# Patient Record
Sex: Male | Born: 1990 | Race: White | Hispanic: No | Marital: Single | Smoking: Never smoker
Health system: Southern US, Community
[De-identification: ages and names within clinical notes are randomized; demographics above are authoritative.]

---

## 2014-03-15 ENCOUNTER — Emergency Department: Payer: Self-pay | Admitting: Internal Medicine

## 2019-04-12 ENCOUNTER — Encounter: Payer: Self-pay | Admitting: Emergency Medicine

## 2019-04-12 ENCOUNTER — Ambulatory Visit
Admission: EM | Admit: 2019-04-12 | Discharge: 2019-04-12 | Disposition: A | Discharge status: Home / Self Care | Payer: BLUE CROSS/BLUE SHIELD | Attending: Emergency Medicine | Admitting: Emergency Medicine

## 2019-04-12 ENCOUNTER — Other Ambulatory Visit: Payer: Self-pay

## 2019-04-12 ENCOUNTER — Ambulatory Visit (INDEPENDENT_AMBULATORY_CARE_PROVIDER_SITE_OTHER): Payer: BLUE CROSS/BLUE SHIELD

## 2019-04-12 DIAGNOSIS — R590 Localized enlarged lymph nodes: Secondary | ICD-10-CM

## 2019-04-12 LAB — CBC WITH DIFFERENTIAL/PLATELET
Abs Immature Granulocytes: 0.01 10*3/uL (ref 0.00–0.07)
Basophils Absolute: 0 10*3/uL (ref 0.0–0.1)
Basophils Relative: 1 %
Eosinophils Absolute: 0.1 10*3/uL (ref 0.0–0.5)
Eosinophils Relative: 2 %
HCT: 42.8 % (ref 39.0–52.0)
Hemoglobin: 14.8 g/dL (ref 13.0–17.0)
Immature Granulocytes: 0 %
Lymphocytes Relative: 41 %
Lymphs Abs: 2.4 10*3/uL (ref 0.7–4.0)
MCH: 29.7 pg (ref 26.0–34.0)
MCHC: 34.6 g/dL (ref 30.0–36.0)
MCV: 85.8 fL (ref 80.0–100.0)
Monocytes Absolute: 0.6 10*3/uL (ref 0.1–1.0)
Monocytes Relative: 11 %
Neutro Abs: 2.6 10*3/uL (ref 1.7–7.7)
Neutrophils Relative %: 45 %
Platelets: 189 10*3/uL (ref 150–400)
RBC: 4.99 MIL/uL (ref 4.22–5.81)
RDW: 12.5 % (ref 11.5–15.5)
WBC: 5.7 10*3/uL (ref 4.0–10.5)
nRBC: 0 % (ref 0.0–0.2)

## 2019-04-12 NOTE — ED Provider Notes (Signed)
[facility] ____________________________________________  Time seen: Approximately 8:36 AM  I have reviewed the triage vital signs and the nursing notes.   HISTORY  Chief Complaint Mass   HPI Luis Willis is a 28 y.o. male presenting for evaluation of mass near his collarbone that he noticed yesterday.  Patient reports that he reached to scratch his right upper chest and felt the enlargement.  Denies pain.  States that he can move the swollen area around and it is not tender.  Denies any skin changes.  Denies any recent cough, congestion, sore throat, headache, rash, insect bite or injury.  Denies chest pain, shortness of breath.  Denies recent lifestyle changes.  No history of smoking.  States that he feels well.  Does continue to remain active.   History reviewed. No pertinent past medical history. Denies   There are no active problems to display for this patient.   History reviewed. No pertinent surgical history.   No current facility-administered medications for this encounter.  No current outpatient medications on file.  Allergies Patient has no known allergies.  Family History  Problem Relation Age of Onset  . Healthy Mother   . Healthy Father     Social History Social History   Tobacco Use  . Smoking status: Never Smoker  . Smokeless tobacco: Never Used  Substance Use Topics  . Alcohol use: Not Currently  . Drug use: Never    Review of Systems Constitutional: No fever ENT: No sore throat. As above.  Cardiovascular: Denies chest pain. Respiratory: Denies shortness of breath. Gastrointestinal: No abdominal pain.   Musculoskeletal: Negative for back pain. Skin: Negative for rash. Neurological: Negative for headaches, focal weakness or numbness.  ____________________________________________   PHYSICAL EXAM:  VITAL SIGNS: ED Triage Vitals  Enc Vitals Group     BP 04/12/19 0818 110/72     Pulse Rate 04/12/19 0818 (!) 55     Resp  04/12/19 0818 16     Temp 04/12/19 0818 97.7 F (36.5 C)     Temp Source 04/12/19 0818 Oral     SpO2 04/12/19 0818 100 %     Weight 04/12/19 0815 185 lb (83.9 kg)     Height 04/12/19 0815 6' (1.829 m)     Head Circumference --      Peak Flow --      Pain Score 04/12/19 0815 0     Pain Loc --      Pain Edu? --      Excl. in GC? --     Constitutional: Alert and oriented. Well appearing and in no acute distress. Eyes: Conjunctivae are normal. PERRL.  ENT      Head: Normocephalic and atraumatic.      Nose: No congestion Neck: No stridor. Supple without meningismus.  Hematological/Lymphatic/Immunilogical: Right supraclavicular approximately 1-1.5 centimeter round mobile nontender lymph node.  No cervical lymphadenopathy.  No axillary, inguinal or popliteal lymphadenopathy noted. Cardiovascular: Normal rate, regular rhythm. Grossly normal heart sounds.  Good peripheral circulation. Respiratory: Normal respiratory effort without tachypnea nor retractions. Breath sounds are clear and equal bilaterally. No wheezes, rales, rhonchi. Gastrointestinal: Soft and nontender. Musculoskeletal: Steady gait. Neurologic:  Normal speech and language. Speech is normal. No gait instability.  Skin:  Skin is warm, dry and intact. No rash noted. Psychiatric: Mood and affect are normal. Speech and behavior are normal. Patient exhibits appropriate insight and judgment   ___________________________________________   LABS (all labs ordered are listed, but only abnormal results are displayed)  Labs Reviewed  CBC WITH DIFFERENTIAL/PLATELET    RADIOLOGY  Dg Chest 2 View  Result Date: 04/12/2019 CLINICAL DATA:  Right supraclavicular adenopathy. EXAM: CHEST - 2 VIEW COMPARISON:  None. FINDINGS: The heart size and mediastinal contours are within normal limits. Both lungs are clear. The visualized skeletal structures are unremarkable. IMPRESSION: No active cardiopulmonary disease. Electronically Signed   By:  Signa Kell M.D.   On: 04/12/2019 09:10   ____________________________________________   PROCEDURES Procedures    INITIAL IMPRESSION / ASSESSMENT AND PLAN / ED COURSE  Pertinent labs & imaging results that were available during my care of the patient were reviewed by me and considered in my medical decision making (see chart for details).  Very well-appearing patient.  No acute distress.  Right supraclavicular lymphadenopathy noted.  No recent sickness or infection.  Does not appear infectious.  Discussed potential inflammatory cause versus malignancy.  Will evaluate chest x-ray and CBC at this time.  Chest x-ray no active cardiopulmonary disease.  CBC unremarkable.  Recommend for follow-up with ENT.  Nursing staff called ENT and they will be able to see patient today at 2 PM.  Patient agrees this plan.  Monitor and keep ENT follow-up.  Discussed follow up with Primary care physician this week. Discussed follow up and return parameters including no resolution or any worsening concerns. Patient verbalized understanding and agreed to plan.   ____________________________________________   FINAL CLINICAL IMPRESSION(S) / ED DIAGNOSES  Final diagnoses:  Supraclavicular lymphadenopathy     ED Discharge Orders    None       Note: This dictation was prepared with Dragon dictation along with smaller phrase technology. Any transcriptional errors that result from this process are unintentional.         Renford Dills, NP 04/12/19 [phone]

## 2019-04-12 NOTE — ED Triage Notes (Signed)
Patient states that he noticed a lump above his collar bone on the right side yesterday.  Patient denies any cold symptoms or fevers.

## 2019-04-12 NOTE — Discharge Instructions (Addendum)
Monitor.  Follow-up with ear nose and throat today as discussed.  Return to urgent care as needed

## 2020-01-08 IMAGING — CR CHEST - 2 VIEW
2 series · 3 of 3 positions shown · non-contrast
Comparison: None.

CLINICAL DATA: Right supraclavicular adenopathy.

EXAM:
CHEST - 2 VIEW

[Series 1: chest pa · 0.14mm/px · 2 of 2 slices shown]
[im 1/2]
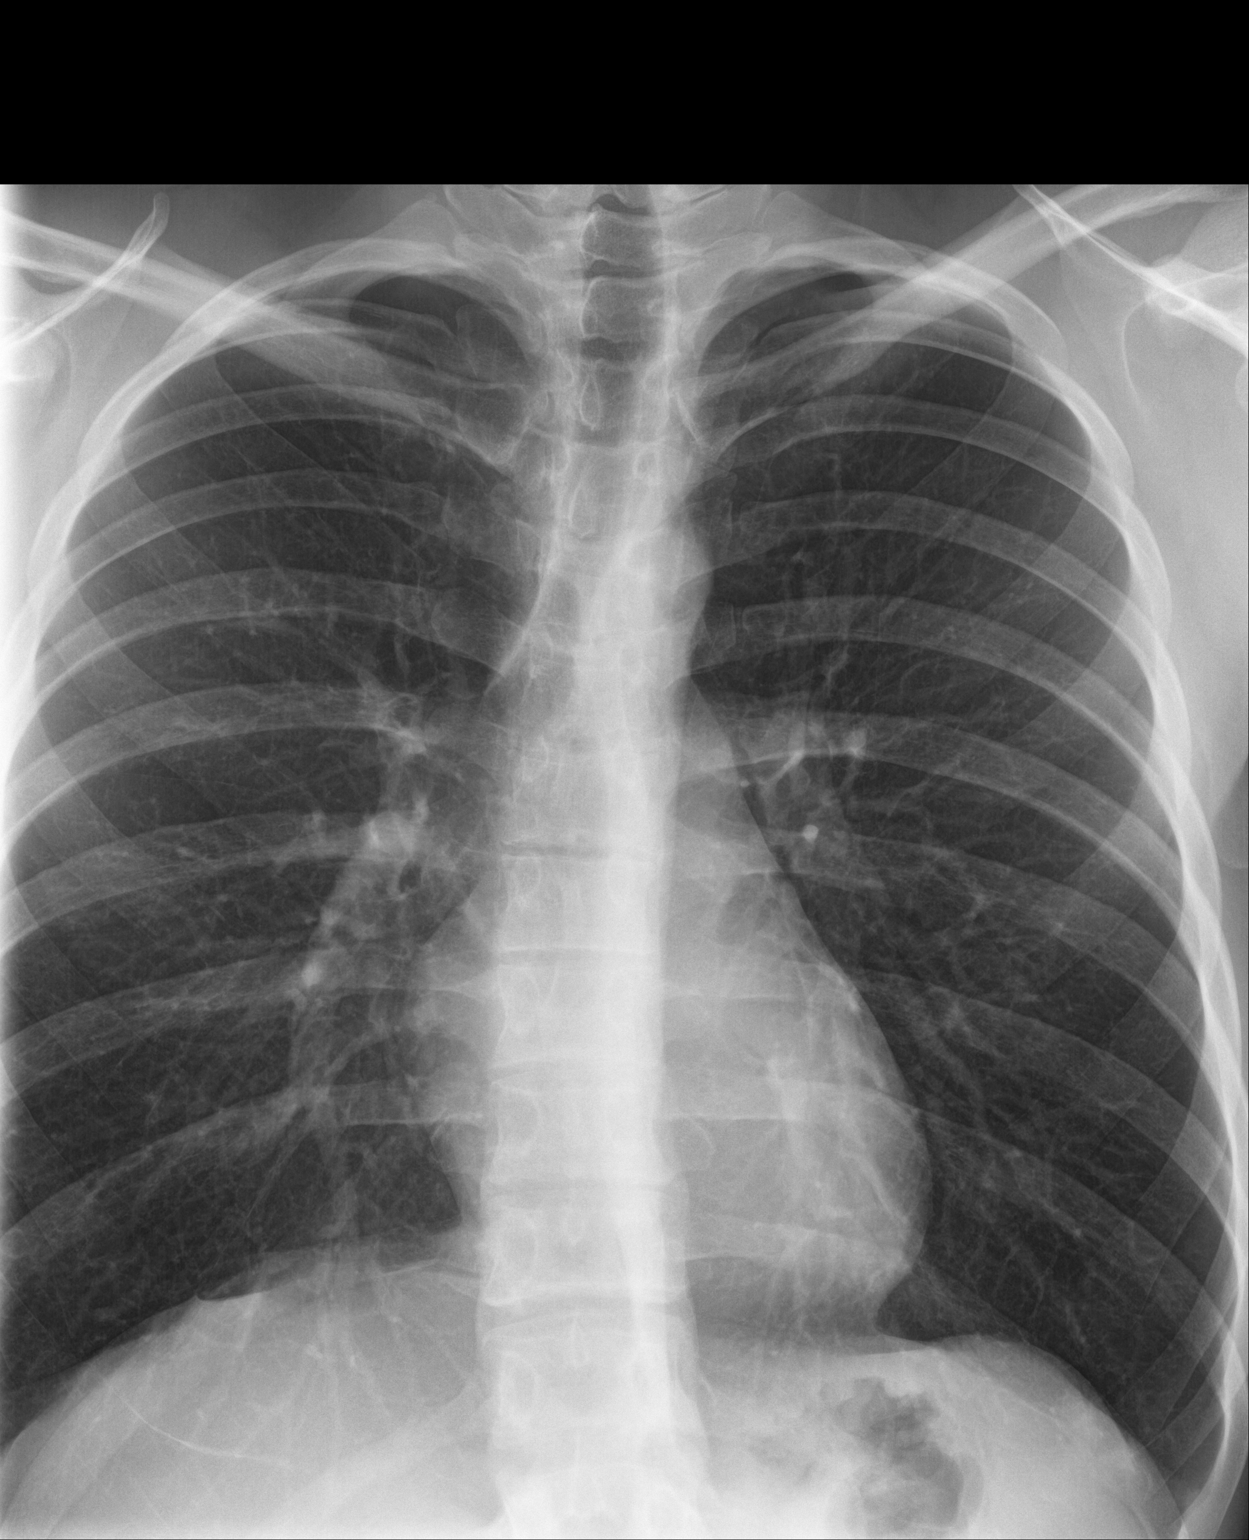
[im 2/2]
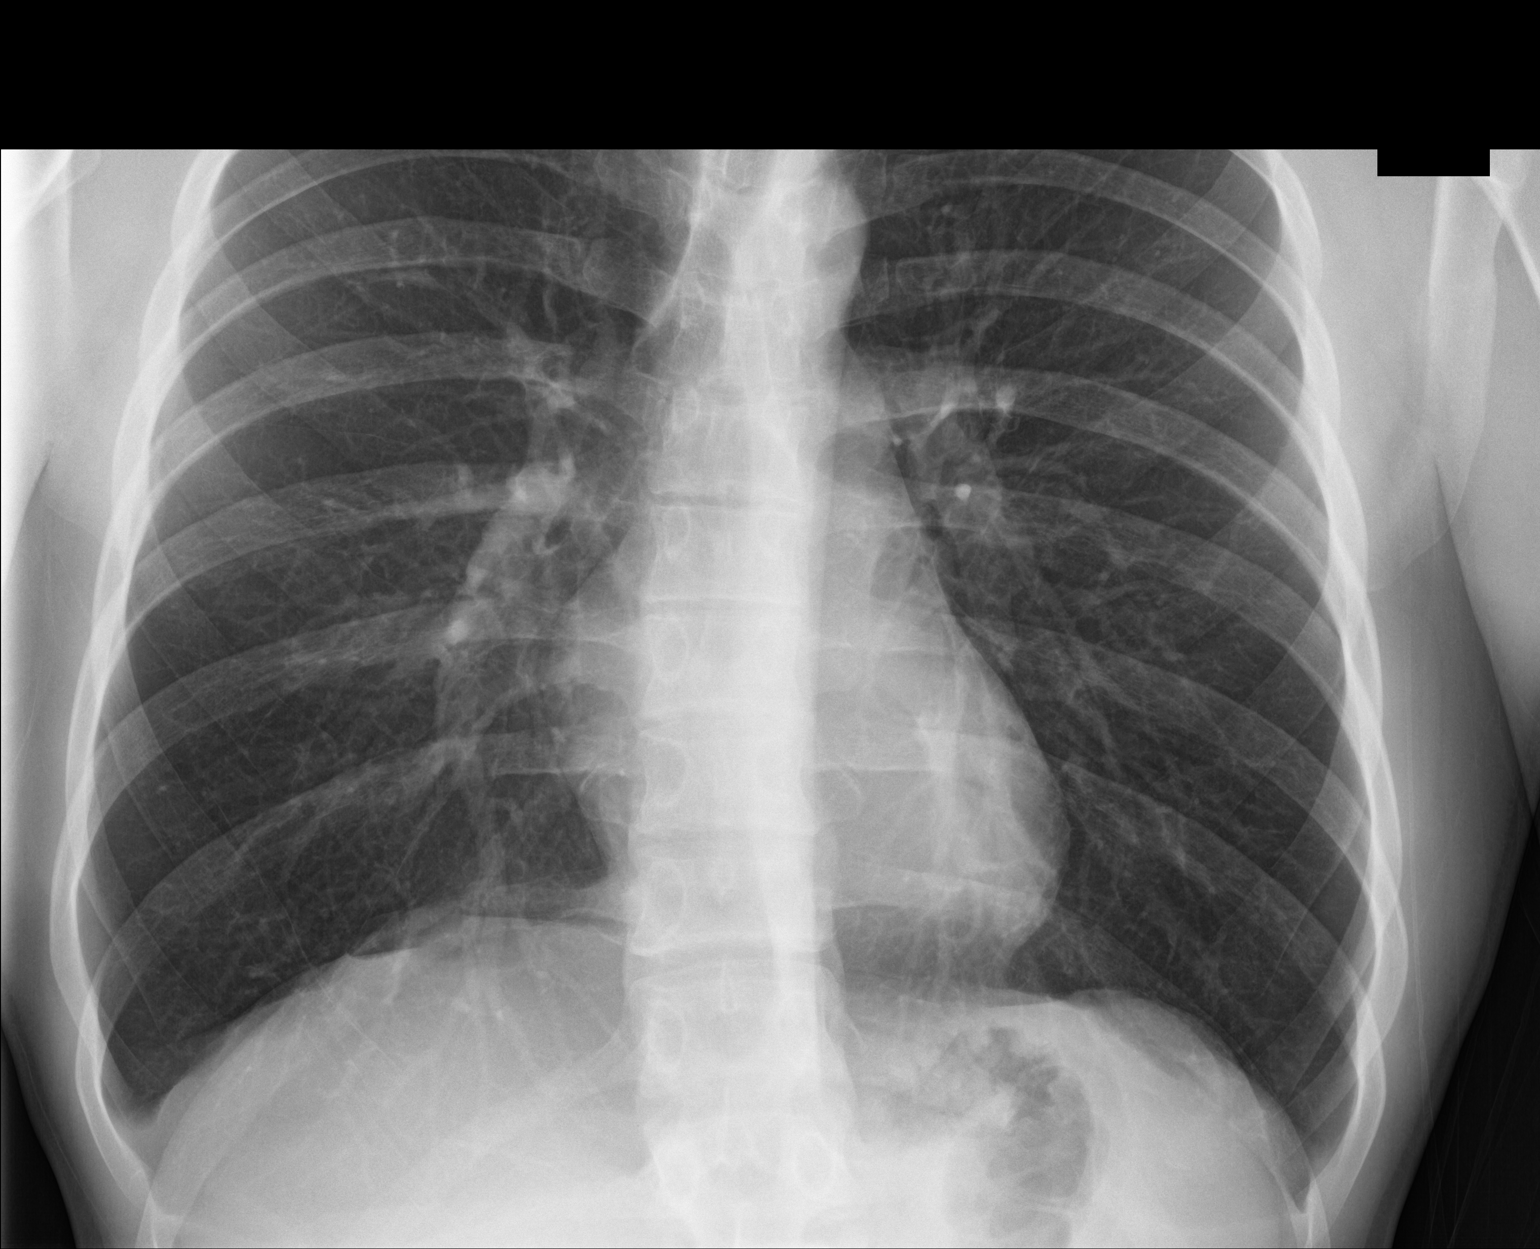

[chest lat]
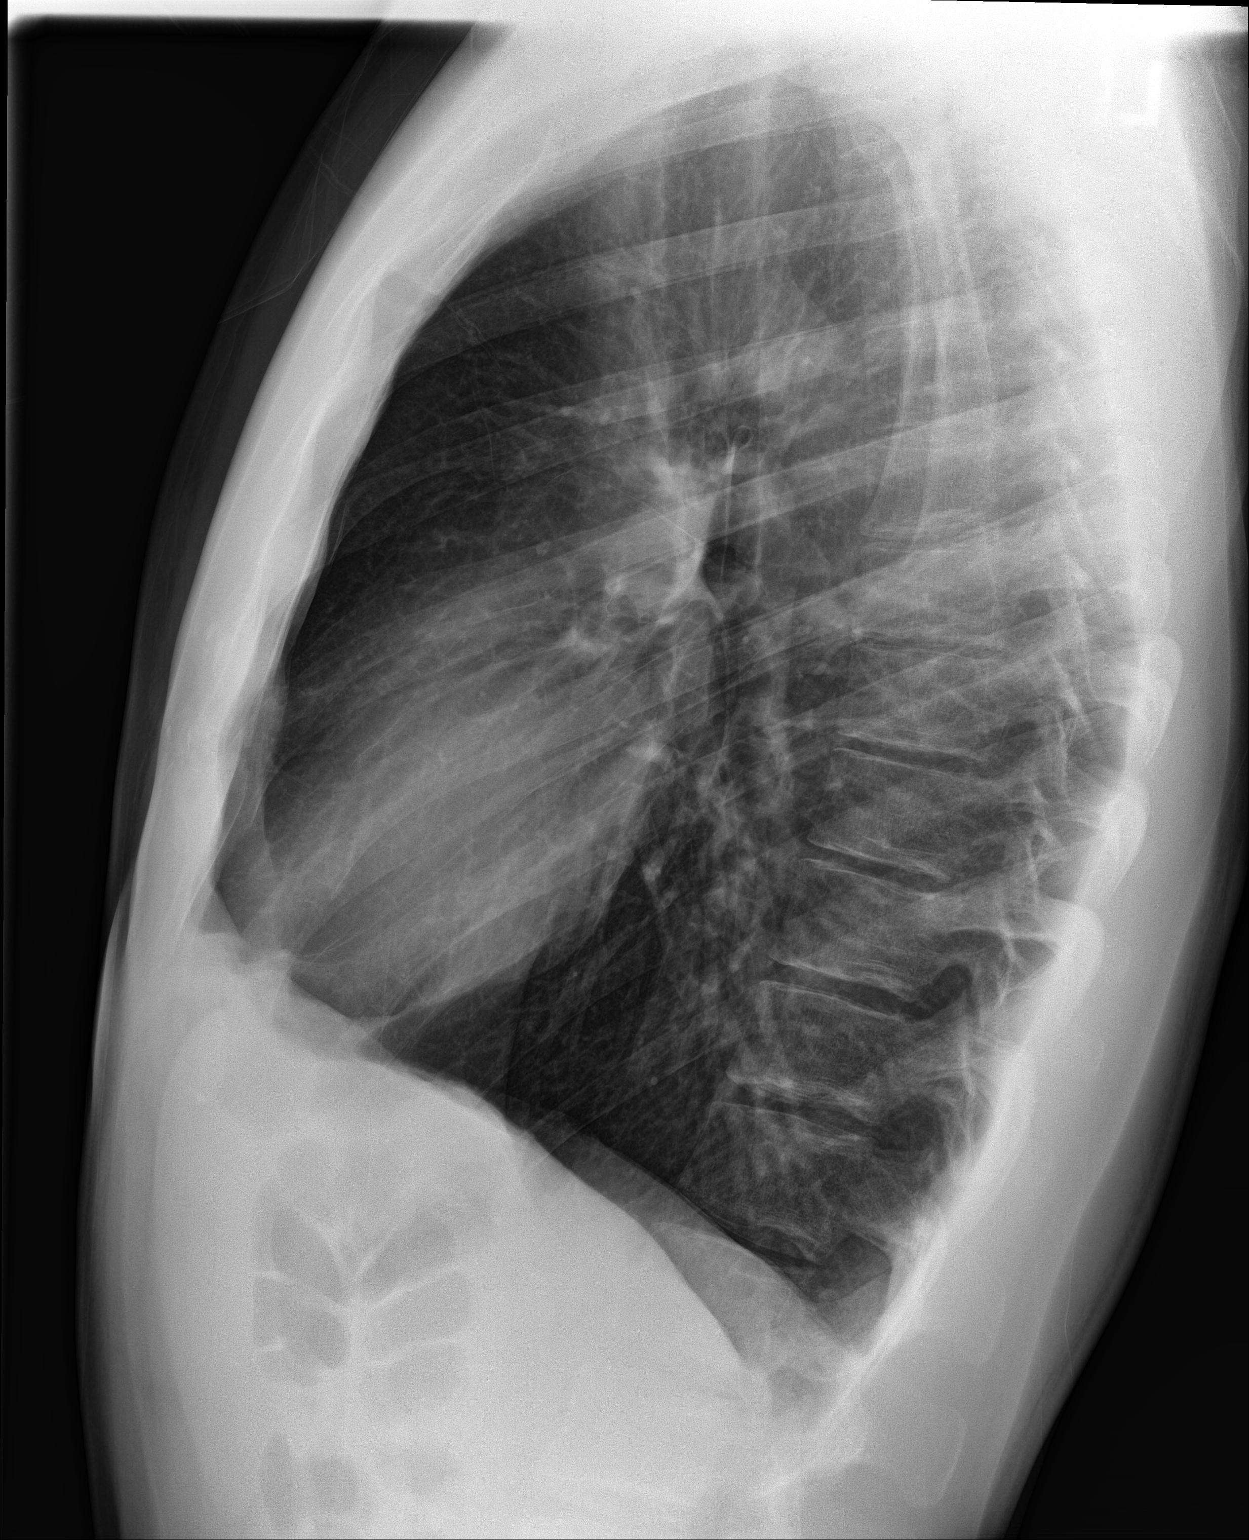

[3 of 3 positions shown; findings below may reference images not displayed]

FINDINGS: The heart size and mediastinal contours are within normal limits.
Both lungs are clear. The visualized skeletal structures are
unremarkable.
IMPRESSION: No active cardiopulmonary disease.

## 2021-05-01 ENCOUNTER — Other Ambulatory Visit: Payer: Self-pay | Admitting: Family Medicine

## 2021-05-01 DIAGNOSIS — R599 Enlarged lymph nodes, unspecified: Secondary | ICD-10-CM

## 2021-05-22 ENCOUNTER — Ambulatory Visit
Admission: RE | Admit: 2021-05-22 | Discharge: 2021-05-22 | Disposition: A | Discharge status: Home / Self Care | Payer: 59 | Source: Ambulatory Visit | Attending: Family Medicine | Admitting: Family Medicine

## 2021-05-22 ENCOUNTER — Other Ambulatory Visit: Payer: Self-pay

## 2021-05-22 DIAGNOSIS — R599 Enlarged lymph nodes, unspecified: Secondary | ICD-10-CM

## 2021-05-22 MED ORDER — IOHEXOL 300 MG/ML  SOLN
75.0000 mL | Freq: Once | INTRAMUSCULAR | Status: AC | PRN
  Administered 2021-05-22: 75 mL via INTRAVENOUS

## 2022-02-17 IMAGING — CT CT NECK W/ CM
4 series · 14 of 33 positions shown, 17 images · IV contrast (omnipaque)
Comparison: No pertinent prior exams available for comparison.

CLINICAL DATA: Provided history: Enlarged lymph node. Additional
history provided by scanning technologist: Patient reports swollen
lymph node under right clavicle, first noticed 2 years ago.

EXAM:
CT NECK WITH CONTRAST
TECHNIQUE: Multidetector CT imaging of the neck was performed using the
standard protocol following the bolus administration of intravenous
contrast.
CONTRAST:  75mL OMNIPAQUE IOHEXOL 300 MG/ML  SOLN

[Series 2: axial neck · axial · 0.62mm/px · z∈[-334,-150]mm · 5 of 138 slices shown, 7 images]
[im 23/138  soft-tissue]
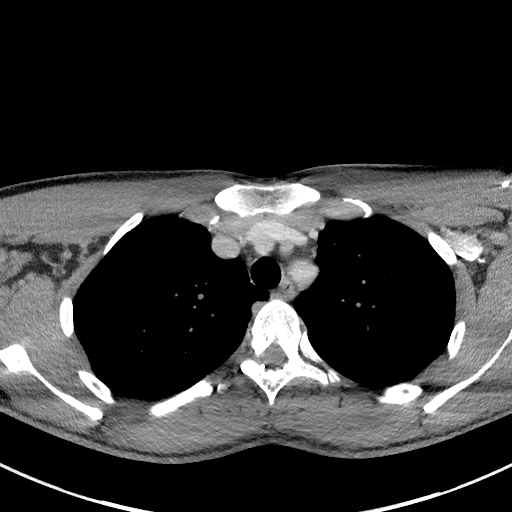
[im 23/138  bone]
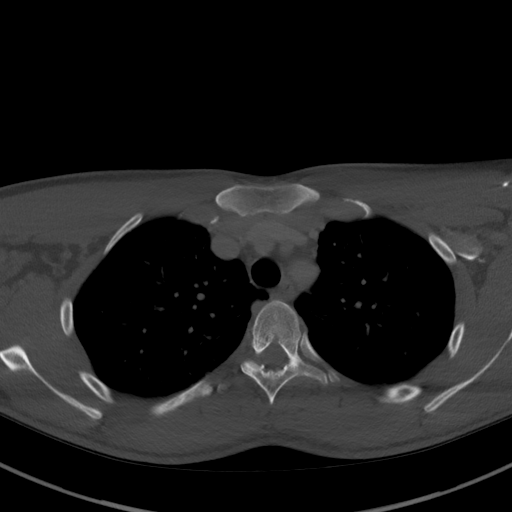
[im 46/138  bone]
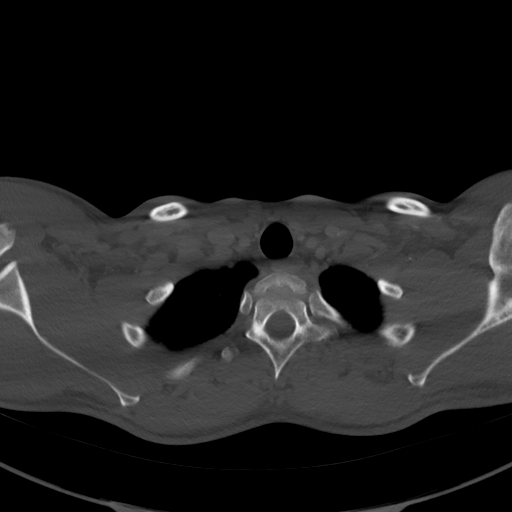
[im 69/138  bone]
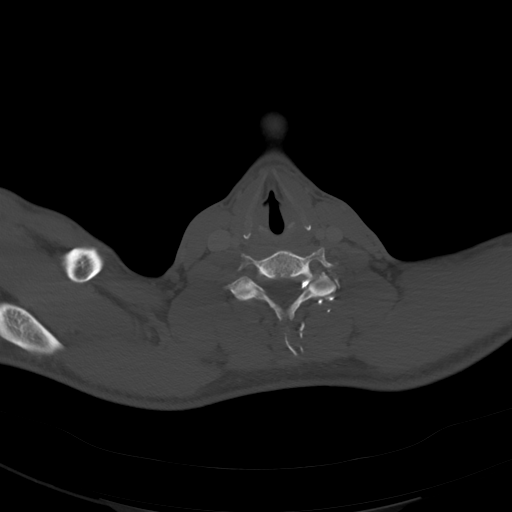
[im 92/138  bone]
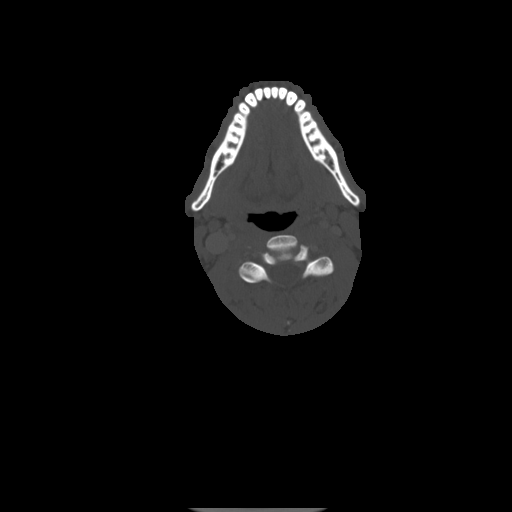
[im 115/138  soft-tissue]
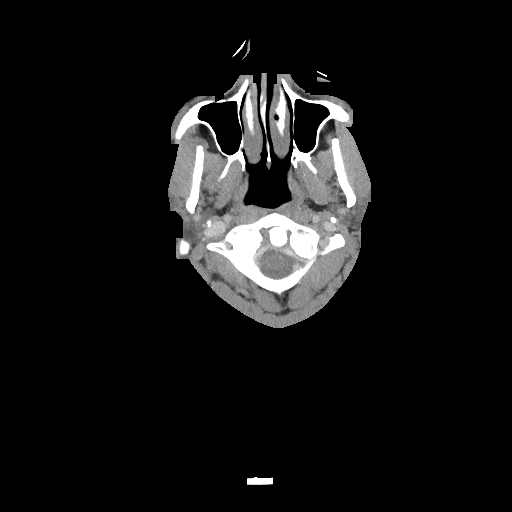
[im 115/138  bone]
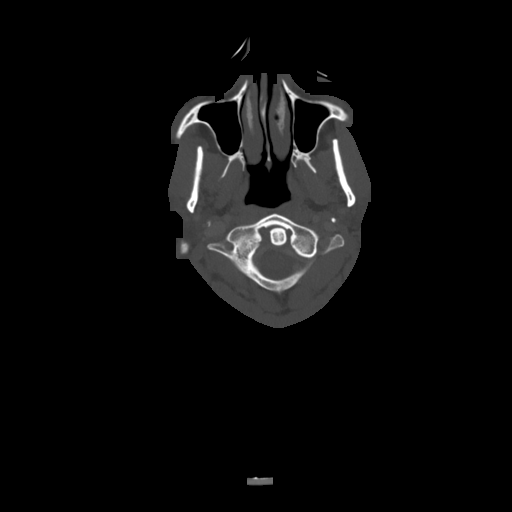

[Series 5: sag neck · sagittal · 0.61mm/px · 5 of 86 slices shown, 6 images]
[im 29/86  bone]
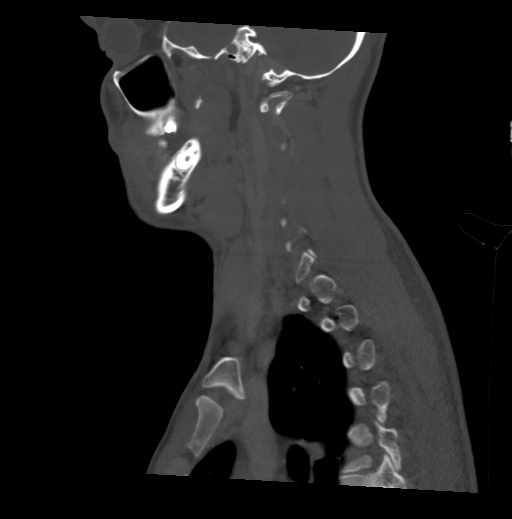
[im 36/86  bone]
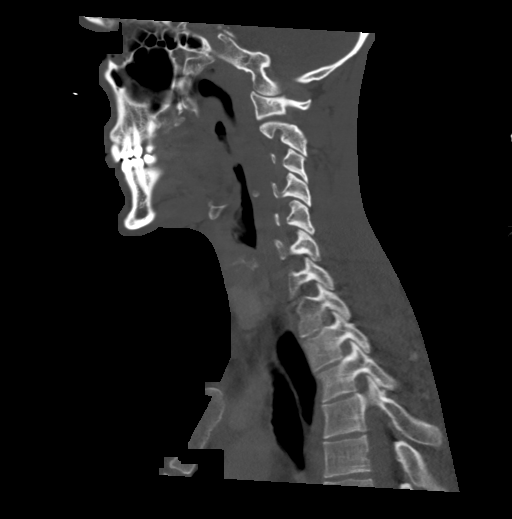
[im 43/86  soft-tissue]
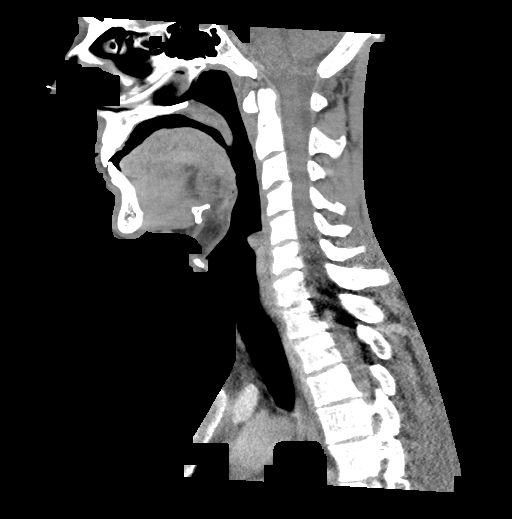
[im 43/86  bone]
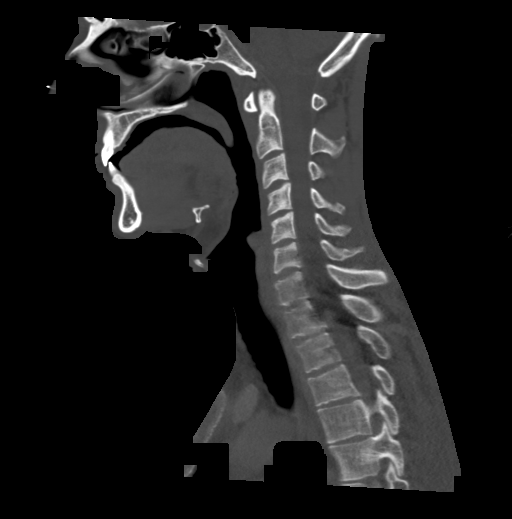
[im 50/86  bone]
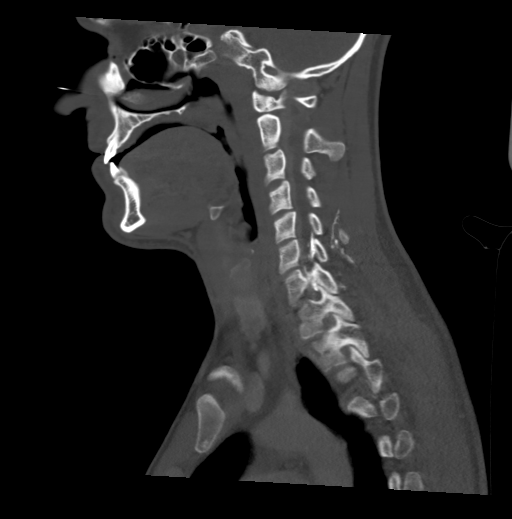
[im 57/86  bone]
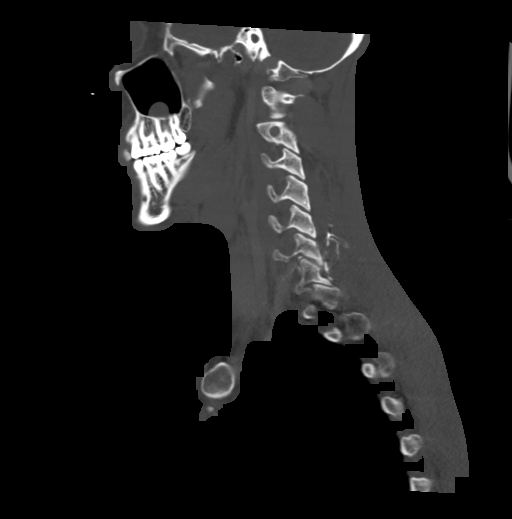

[Series 6: cor neck · coronal · 0.43mm/px · 3 of 133 slices shown]
[im 27/133  bone]
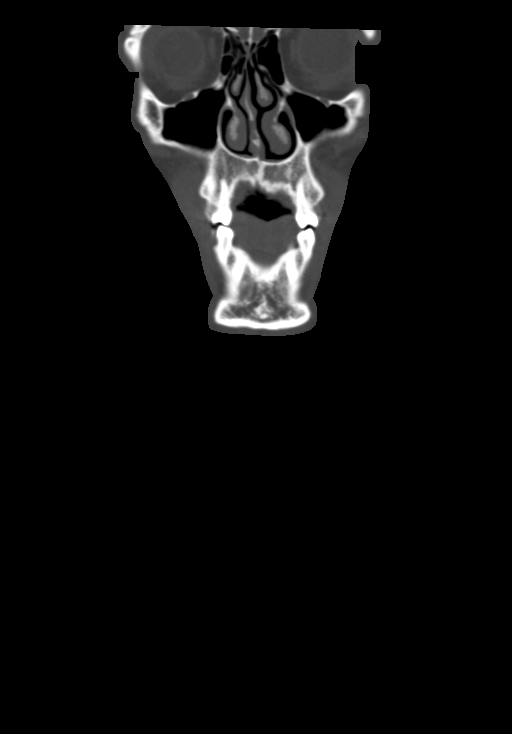
[im 53/133  bone]
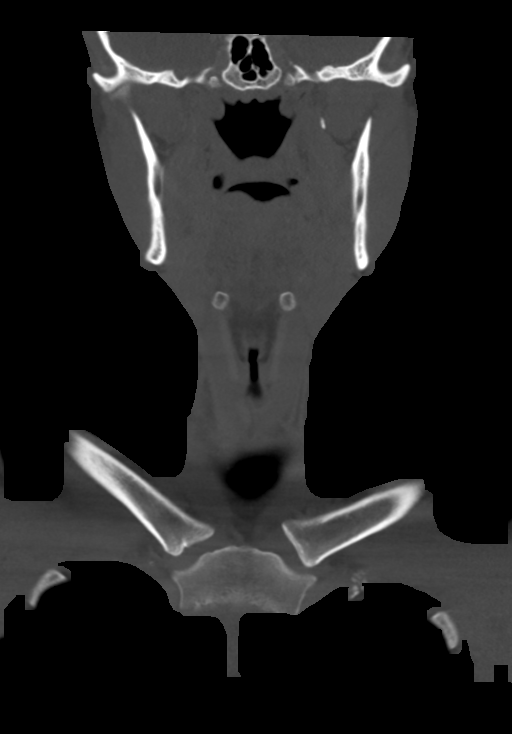
[im 80/133  bone]
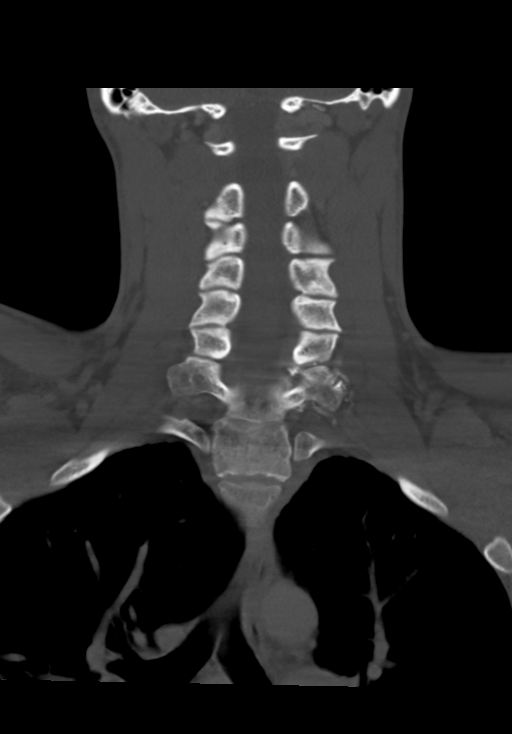

[Series 7: orthogonal ax · axial · 0.42mm/px · 1 of 150 slices shown]
[im 25/150  bone]
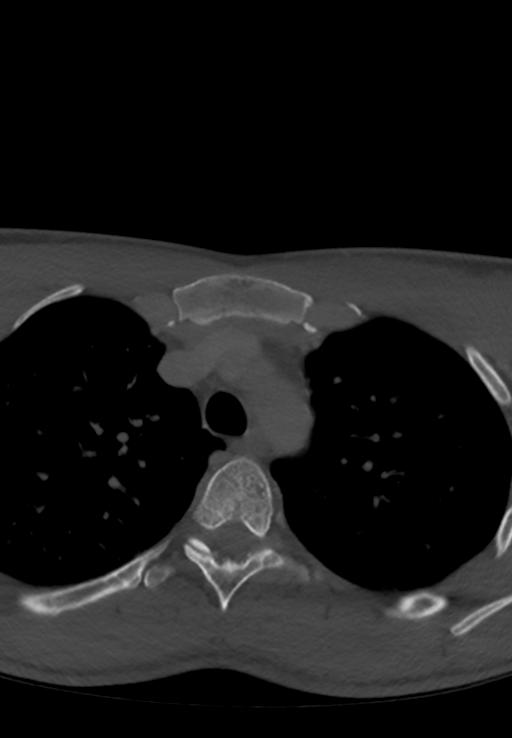

[14 of 33 positions shown; findings below may reference images not displayed]

FINDINGS: Pharynx and larynx: No appreciable swelling or discrete mass within
the oral cavity, pharynx or larynx. Postinflammatory calcification
within the right palatine tonsil.

Salivary glands: No inflammation, mass, or stone.

Thyroid: Unremarkable.

Lymph nodes: No pathologically enlarged cervical chain lymph nodes
are identified. No pathologically enlarged lymph node or abnormality
is identified deep to the skin surface marker overlying the right
clavicle.

Vascular: The major vascular structures of the neck are patent.

Limited intracranial: No evidence of acute intracranial mallet
within the field of view.

Visualized orbits: Incompletely imaged. No mass or acute finding at
the imaged levels.

Mastoids and visualized paranasal sinuses: Small left maxillary
sinus mucous retention cyst. No significant mastoid effusion.

Skeleton: No acute bony abnormality or aggressive osseous lesion.

Upper chest: No consolidation within the imaged lung apices.
IMPRESSION: No neck mass or cervical lymphadenopathy identified.

No pathologically enlarged lymph node or other abnormality
identified deep to the skin surface marker overlying the right
clavicle.

Small left maxillary sinus mucous retention cyst.
